# Patient Record
Sex: Female | Born: 2005
Health system: Southern US, Community
[De-identification: ages and names within clinical notes are randomized; demographics above are authoritative.]

## PROBLEM LIST (undated history)

## (undated) HISTORY — PX: APPENDECTOMY: SHX54

## (undated) HISTORY — PX: EAR TUBE REMOVAL: SHX1486

---
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
## 2023-09-18 ENCOUNTER — Encounter: Payer: Self-pay | Admitting: Emergency Medicine

## 2023-09-18 ENCOUNTER — Ambulatory Visit
Admission: EM | Admit: 2023-09-18 | Discharge: 2023-09-18 | Disposition: A | Discharge status: Home / Self Care | Payer: Self-pay | Attending: Emergency Medicine | Admitting: Emergency Medicine

## 2023-09-18 DIAGNOSIS — N898 Other specified noninflammatory disorders of vagina: Secondary | ICD-10-CM | POA: Diagnosis present

## 2023-09-18 DIAGNOSIS — R300 Dysuria: Secondary | ICD-10-CM | POA: Diagnosis present

## 2023-09-18 DIAGNOSIS — R350 Frequency of micturition: Secondary | ICD-10-CM

## 2023-09-18 LAB — POCT URINALYSIS DIP (MANUAL ENTRY)
Bilirubin, UA: NEGATIVE
Blood, UA: NEGATIVE
Glucose, UA: NEGATIVE mg/dL
Ketones, POC UA: NEGATIVE mg/dL
Leukocytes, UA: NEGATIVE
Nitrite, UA: NEGATIVE
Protein Ur, POC: NEGATIVE mg/dL
Spec Grav, UA: 1.02 (ref 1.010–1.025)
Urobilinogen, UA: 1 U/dL
pH, UA: 8.5 — AB (ref 5.0–8.0)

## 2023-09-18 MED ORDER — FLUCONAZOLE 150 MG PO TABS: 150.0000 mg | ORAL_TABLET | ORAL | 0 refills | Status: AC

## 2023-09-18 MED ORDER — METRONIDAZOLE 500 MG PO TABS: 500.0000 mg | ORAL_TABLET | Freq: Two times a day (BID) | ORAL | 0 refills | Status: AC

## 2023-09-18 NOTE — ED Triage Notes (Signed)
 Patient reports clumpy and white discharge x 2 weeks. Patient reports discomfort when she urinate. Pimple that popped close to vaginal area 3 days ago.

## 2023-09-18 NOTE — ED Provider Notes (Signed)
 Brooke Cooper    CSN: [id number] Arrival date & time: 09/18/23  1205      History   Chief Complaint Chief Complaint  Patient presents with   Vaginal Discharge    HPI Brooke Cooper is a 18 y.o. female.   Patient presents for evaluation of thick Deoni Cosey vaginal discharge with odor and itching beginning 2 weeks ago.  She had a dysuria but denies frequency, hematuria or urgency.  Has been experiencing intermittent lower abdominal pain but denies flank pain.  Endorses in the past month she has been treated for chlamydia, taking all antibiotic as directed, shortly after it began to have UTI symptoms and was treated for such with an antibiotic, does not feel that symptoms fully resolved .  Sexually active, no known exposure.  Last menstrual period August 26, 2023.  History reviewed. No pertinent past medical history.  Patient Active Problem List   Diagnosis Date Noted   Acute appendicitis 02/19/2016   Acute appendicitis with localized peritonitis     Past Surgical History:  Procedure Laterality Date   APPENDECTOMY     EAR TUBE REMOVAL     x2   LAPAROSCOPIC APPENDECTOMY N/A 02/19/2016   Procedure: APPENDECTOMY LAPAROSCOPIC;  Surgeon: Charlie FORBES Fell, MD;  Location: [facility] ORS;  Service: General;  Laterality: N/A;    OB History   No obstetric history on file.      Home Medications    Prior to Admission medications   Medication Sig Start Date End Date Taking? Authorizing Provider  fluconazole  (DIFLUCAN ) 150 MG tablet Take 1 tablet (150 mg total) by mouth once a week for 2 doses. 09/18/23 09/26/23 Yes Dirck Butch R, NP  metroNIDAZOLE  (FLAGYL ) 500 MG tablet Take 1 tablet (500 mg total) by mouth 2 (two) times daily. 09/18/23  Yes Teresa Shelba SAUNDERS, NP    Family History History reviewed. No pertinent family history.  Social History Social History   Tobacco Use   Smoking status: Never   Smokeless tobacco: Never  Substance Use Topics   Alcohol use: No      Allergies   Patient has no known allergies.   Review of Systems Review of Systems  Genitourinary:  Positive for vaginal discharge.     Physical Exam Triage Vital Signs ED Triage Vitals  Encounter Vitals Group     BP 09/18/23 1226 103/71     Girls Systolic BP Percentile --      Girls Diastolic BP Percentile --      Boys Systolic BP Percentile --      Boys Diastolic BP Percentile --      Pulse Rate 09/18/23 1226 81     Resp 09/18/23 1226 18     Temp 09/18/23 1226 98.8 F (37.1 C)     Temp Source 09/18/23 1226 Oral     SpO2 09/18/23 1226 97 %     Weight --      Height --      Head Circumference --      Peak Flow --      Pain Score 09/18/23 1230 0     Pain Loc --      Pain Education --      Exclude from Growth Chart --    No data found.  Updated Vital Signs BP 103/71 (BP Location: Left Arm)   Pulse 81   Temp 98.8 F (37.1 C) (Oral)   Resp 18   LMP 08/26/2023 (Exact Date)   SpO2 97%  Visual Acuity Right Eye Distance:   Left Eye Distance:   Bilateral Distance:    Right Eye Near:   Left Eye Near:    Bilateral Near:     Physical Exam Constitutional:      Appearance: Normal appearance.  Eyes:     Extraocular Movements: Extraocular movements intact.  Pulmonary:     Effort: Pulmonary effort is normal.  Abdominal:     Tenderness: There is no abdominal tenderness. There is no right CVA tenderness, left CVA tenderness or guarding.  Genitourinary:    Comments: deferred Neurological:     Mental Status: She is alert and oriented to person, place, and time. Mental status is at baseline.      UC Treatments / Results  Labs (all labs ordered are listed, but only abnormal results are displayed) Labs Reviewed  POCT URINALYSIS DIP (MANUAL ENTRY) - Abnormal; Notable for the following components:      Result Value   Clarity, UA cloudy (*)    pH, UA 8.5 (*)    All other components within normal limits  URINE CULTURE  CERVICOVAGINAL ANCILLARY ONLY     EKG   Radiology No results found.  Procedures Procedures (including critical care time)  Medications Ordered in UC Medications - No data to display  Initial Impression / Assessment and Plan / UC Course  I have reviewed the triage vital signs and the nursing notes.  Pertinent labs & imaging results that were available during my care of the patient were reviewed by me and considered in my medical decision making (see chart for details).  Vaginal discharge, dysuria  Urinalysis negative, discussed findings with patient, etiology most likely vaginal infection, treating empirically for bacterial vaginosis and yeast based on symptomology, metronidazole  and Diflucan  sent to pharmacy, discussed administration, advised abstaining from alcohol during treatment, advised abstinence from sexual intercourse until lab results, treatment is complete and all symptoms have resolved, may follow-up with urgent care as needed Final Clinical Impressions(s) / UC Diagnoses   Final diagnoses:  Vaginal discharge  Dysuria     Discharge Instructions      Today you are being treated for bacterial vaginosis and yeast based on your symptoms  Urinalysis is negative for bladder infection  Take Metronidazole  500 mg twice a day for 7 days, do not drink alcohol while using medication, this will make you feel sick   Take 1 Diflucan  tablet today and then take second dose after completion of antibiotic in 1 week  Labs pending 2-3 days, you will be contacted if positive for any sti and treatment will be sent to the pharmacy, you will have to return to the clinic if positive for gonorrhea to receive treatment   Please refrain from having sex until labs results, if positive please refrain from having sex until treatment complete and symptoms resolve   If positive for Chlamydia  gonorrhea or trichomoniasis please notify partner or partners so they may tested as well  Moving forward, it is recommended you use  some form of protection against the transmission of sti infections  such as condoms or dental dams with each sexual encounter     In addition: Avoid baths, hot tubs and whirlpool spas.  Don't use scented or harsh soaps Avoid irritants. These include scented tampons and pads. Wipe from front to back after using the toilet. Don't douche. Your vagina doesn't require cleansing other than normal bathing.  Use a condom.  Wear cotton underwear, this fabric absorbs some moisture.  ED Prescriptions     Medication Sig Dispense Auth. Provider   metroNIDAZOLE  (FLAGYL ) 500 MG tablet Take 1 tablet (500 mg total) by mouth 2 (two) times daily. 14 tablet Olegario Emberson R, NP   fluconazole  (DIFLUCAN ) 150 MG tablet Take 1 tablet (150 mg total) by mouth once a week for 2 doses. 2 tablet Holden Maniscalco R, NP      PDMP not reviewed this encounter.   Teresa Shelba SAUNDERS, NP 09/18/23 1256

## 2023-09-18 NOTE — Discharge Instructions (Addendum)
 Today you are being treated for bacterial vaginosis and yeast based on your symptoms  Urinalysis is negative for bladder infection  Take Metronidazole  500 mg twice a day for 7 days, do not drink alcohol while using medication, this will make you feel sick   Take 1 Diflucan  tablet today and then take second dose after completion of antibiotic in 1 week  Labs pending 2-3 days, you will be contacted if positive for any sti and treatment will be sent to the pharmacy, you will have to return to the clinic if positive for gonorrhea to receive treatment   Please refrain from having sex until labs results, if positive please refrain from having sex until treatment complete and symptoms resolve   If positive for Chlamydia  gonorrhea or trichomoniasis please notify partner or partners so they may tested as well  Moving forward, it is recommended you use some form of protection against the transmission of sti infections  such as condoms or dental dams with each sexual encounter     In addition: Avoid baths, hot tubs and whirlpool spas.  Don't use scented or harsh soaps Avoid irritants. These include scented tampons and pads. Wipe from front to back after using the toilet. Don't douche. Your vagina doesn't require cleansing other than normal bathing.  Use a condom.  Wear cotton underwear, this fabric absorbs some moisture.

## 2023-09-19 LAB — CERVICOVAGINAL ANCILLARY ONLY
Bacterial Vaginitis (gardnerella): NEGATIVE
Candida Glabrata: NEGATIVE
Candida Vaginitis: POSITIVE — AB
Chlamydia: NEGATIVE
Comment: NEGATIVE
Comment: NEGATIVE
Comment: NEGATIVE
Comment: NEGATIVE
Comment: NEGATIVE
Comment: NORMAL
Neisseria Gonorrhea: NEGATIVE
Trichomonas: NEGATIVE

## 2023-09-19 LAB — URINE CULTURE: Culture: NO GROWTH
